# Patient Record
Sex: Female | Born: 1960 | Race: White | Hispanic: No | Marital: Married | Smoking: Never smoker
Health system: Southern US, Community
[De-identification: ages and names within clinical notes are randomized; demographics above are authoritative.]

## PROBLEM LIST (undated history)

## (undated) HISTORY — PX: LAPAROSCOPIC GASTRIC BAND REMOVAL WITH LAPAROSCOPIC GASTRIC SLEEVE RESECTION: SHX6498

---

## 2016-06-29 ENCOUNTER — Emergency Department (HOSPITAL_COMMUNITY): Payer: BLUE CROSS/BLUE SHIELD

## 2016-06-29 ENCOUNTER — Emergency Department (HOSPITAL_COMMUNITY)
Admission: EM | Admit: 2016-06-29 | Discharge: 2016-06-29 | Disposition: A | Discharge status: Home / Self Care | Payer: BLUE CROSS/BLUE SHIELD | Attending: Emergency Medicine | Admitting: Emergency Medicine

## 2016-06-29 ENCOUNTER — Encounter (HOSPITAL_COMMUNITY): Payer: Self-pay | Admitting: *Deleted

## 2016-06-29 DIAGNOSIS — R079 Chest pain, unspecified: Secondary | ICD-10-CM | POA: Diagnosis present

## 2016-06-29 DIAGNOSIS — R0789 Other chest pain: Secondary | ICD-10-CM | POA: Insufficient documentation

## 2016-06-29 DIAGNOSIS — R0602 Shortness of breath: Secondary | ICD-10-CM | POA: Insufficient documentation

## 2016-06-29 DIAGNOSIS — R110 Nausea: Secondary | ICD-10-CM | POA: Insufficient documentation

## 2016-06-29 LAB — CBC
HCT: 40.6 % (ref 36.0–46.0)
Hemoglobin: 13.2 g/dL (ref 12.0–15.0)
MCH: 28.9 pg (ref 26.0–34.0)
MCHC: 32.5 g/dL (ref 30.0–36.0)
MCV: 89 fL (ref 78.0–100.0)
PLATELETS: 170 10*3/uL (ref 150–400)
RBC: 4.56 MIL/uL (ref 3.87–5.11)
RDW: 13.1 % (ref 11.5–15.5)
WBC: 6.5 10*3/uL (ref 4.0–10.5)

## 2016-06-29 LAB — BASIC METABOLIC PANEL
Anion gap: 9 (ref 5–15)
BUN: 10 mg/dL (ref 6–20)
CHLORIDE: 105 mmol/L (ref 101–111)
CO2: 25 mmol/L (ref 22–32)
CREATININE: 0.77 mg/dL (ref 0.44–1.00)
Calcium: 9.4 mg/dL (ref 8.9–10.3)
GFR calc non Af Amer: >60 mL/min (ref >60)
GLUCOSE: 91 mg/dL (ref 65–99)
Potassium: 3.5 mmol/L (ref 3.5–5.1)
Sodium: 139 mmol/L (ref 135–145)

## 2016-06-29 LAB — I-STAT TROPONIN, ED
TROPONIN I, POC: 0 ng/mL (ref 0.00–0.08)
Troponin i, poc: 0 ng/mL (ref 0.00–0.08)

## 2016-06-29 LAB — D-DIMER, QUANTITATIVE: D-Dimer, Quant: 0.33 ug/mL-FEU (ref 0.00–0.50)

## 2016-06-29 MED ORDER — SODIUM CHLORIDE 0.9 % IV BOLUS (SEPSIS)
500.0000 mL | Freq: Once | INTRAVENOUS | Status: AC
Start: 2016-06-29 — End: 2016-06-29
  Administered 2016-06-29: 500 mL via INTRAVENOUS

## 2016-06-29 NOTE — Discharge Instructions (Signed)
Continue taking your home medications as prescribed. I recommend following up with your primary care provider within the next week for follow-up. Return to the emergency department if symptoms worsen or new onset of fever, dizziness, syncope, chest pain, difficulty breathing, abdominal pain, vomiting, numbness, weakness.

## 2016-06-29 NOTE — ED Notes (Signed)
Returned from xray

## 2016-06-29 NOTE — ED Triage Notes (Addendum)
To ED for sudden chest pressure while sitting down to eat breakfast. Nausea, no vomiting. Tearful. Pain 4/10 but 8/10 at worst. Pt flew here on Thursday from [place].

## 2016-06-29 NOTE — ED Notes (Signed)
Patient transported to CT 

## 2016-06-29 NOTE — ED Provider Notes (Signed)
[facility]-EMERGENCY DEPT Provider Note   CSN: [id number] Arrival date & time: 06/29/16  1036     History   Chief Complaint Chief Complaint  Patient presents with  . Chest Pain    HPI Angela Wilkins is a 56 y.o. female.  HPI   Patient is a 56 year old female with no pertinent past medical history presents the ED with complaint of chest pain, onset 10 AM. Patient reports when she was sitting up to order her meal at [facility] this morning she began having pressure and tightness to the right side of her chest. Endorses associated shortness of breath, lightheadedness and nausea that started with onset of chest pain. She notes those symptoms have since resolved since arrival to the ED and states her chest pain has also mildly improved but has not completely resolved. Denies taking any medications prior to arrival. Denies history of similar episodes of chest pain in the past. Denies fever, chills, headache, visual changes, cough, wheezing, palpitations, abdominal pain, vomiting, numbness, tingling, weakness, syncope. Patient denies personal or family history of cardiac disease. Denies smoking. Denies any recent surgeries or hospitalizations, history of cancer, use of exogenous hormones, or leg swelling. However patient notes they recently flew in from [place] 2 days ago and states they are in town visiting.  History reviewed. No pertinent past medical history.  There are no active problems to display for this patient.   Past Surgical History:  Procedure Laterality Date  . CESAREAN SECTION    . LAPAROSCOPIC GASTRIC BAND REMOVAL WITH LAPAROSCOPIC GASTRIC SLEEVE RESECTION      OB History    No data available       Home Medications    Prior to Admission medications   Medication Sig Start Date End Date Taking? Authorizing Provider  Naltrexone-Bupropion HCl ER (CONTRAVE) 8-90 MG TB12 Take 1 tablet by mouth 2 (two) times daily.   Yes [provider]    Family History No family  history on file.  Social History Social History  Substance Use Topics  . Smoking status: Never Smoker  . Smokeless tobacco: Never Used  . Alcohol use Yes     Comment: social      Allergies   Patient has no known allergies.   Review of Systems Review of Systems  Respiratory: Positive for shortness of breath.   Cardiovascular: Positive for chest pain.  Gastrointestinal: Positive for nausea.  Neurological: Positive for light-headedness.  All other systems reviewed and are negative.    Physical Exam Updated Vital Signs BP (!) 151/74   Pulse 82   Temp 98.2 F (36.8 C) (Oral)   Resp 14   SpO2 99%   Physical Exam  Constitutional: She is oriented to person, place, and time. She appears well-developed and well-nourished. No distress.  HENT:  Head: Normocephalic and atraumatic.  Mouth/Throat: Oropharynx is clear and moist. No oropharyngeal exudate.  Eyes: Conjunctivae and EOM are normal. Right eye exhibits no discharge. Left eye exhibits no discharge. No scleral icterus.  Neck: Normal range of motion. Neck supple.  Cardiovascular: Normal rate, regular rhythm, normal heart sounds and intact distal pulses.   Pulmonary/Chest: Effort normal and breath sounds normal. No respiratory distress. She has no wheezes. She has no rales. She exhibits no tenderness.  Abdominal: Soft. She exhibits no distension and no mass. There is no tenderness. There is no rebound and no guarding.  Musculoskeletal: Normal range of motion. She exhibits no edema or tenderness.  Neurological: She is alert and oriented to person,  place, and time.  Skin: Skin is warm and dry. She is not diaphoretic.  Nursing note and vitals reviewed.    ED Treatments / Results  Labs (all labs ordered are listed, but only abnormal results are displayed) Labs Reviewed  BASIC METABOLIC PANEL  CBC  D-DIMER, QUANTITATIVE (NOT AT [facility])  I-STAT TROPOININ, ED  I-STAT TROPOININ, ED    EKG  EKG  Interpretation  Date/Time:  Saturday June 29 2016 10:39:06 EDT Ventricular Rate:  85 PR Interval:  126 QRS Duration: 92 QT Interval:  386 QTC Calculation: 459 R Axis:   82 Text Interpretation:  Normal sinus rhythm Normal ECG No old tracing to compare Confirmed by Mancel Bale [phone]) on 06/29/2016 12:11:58 PM       Radiology Dg Chest 2 View  Result Date: 06/29/2016 CLINICAL DATA:  Right-sided chest pain.  Shortness of breath. EXAM: CHEST  2 VIEW COMPARISON:  None. FINDINGS: Suspected 8 mm nodule in the left base. No other nodules or masses. No pneumothorax. The heart, hila, mediastinum, lungs, and pleura are otherwise unremarkable. IMPRESSION: Suspected 8 mm nodule in the left base. Recommend comparison to outside films if available. If none are available, recommend CT imaging. Electronically Signed   By: Gerome Sam III M.D   On: 06/29/2016 12:46   Ct Chest Wo Contrast  Result Date: 06/29/2016 CLINICAL DATA:  Pt had chest pressure while sitting down this morning. Nausea, no vomiting. Current chest radiograph shows a possible 8 mm left lower lung zone nodule. EXAM: CT CHEST WITHOUT CONTRAST TECHNIQUE: Multidetector CT imaging of the chest was performed following the standard protocol without IV contrast. COMPARISON:  Current chest radiograph FINDINGS: Cardiovascular: Heart is normal size and configuration. No coronary artery calcifications. Great vessels normal in caliber. No significant aortic atherosclerosis. Mediastinum/Nodes: No enlarged mediastinal or axillary lymph nodes. Thyroid gland, trachea, and esophagus demonstrate no significant findings. Lungs/Pleura: Lungs are clear. Specifically, there is no lung nodule. No pleural effusion or pneumothorax. Upper Abdomen: There changes from prior gastric surgery. No acute findings. Musculoskeletal: There is a focus of sclerosis in the anterior left sixth rib consistent with a bone island. This accounts for the nodule noted on the current  chest radiograph. There is no fracture or acute finding. No osteolytic lesions. IMPRESSION: 1. No lung mass or nodule. The small nodule noted on the current chest radiograph is due to a bone island in the anterior left sixth rib. 2. No acute findings. Clear lungs. No coronary artery calcifications. Electronically Signed   By: Amie Portland M.D.   On: 06/29/2016 14:57    Procedures Procedures (including critical care time)  Medications Ordered in ED Medications  sodium chloride 0.9 % bolus 500 mL (500 mLs Intravenous New Bag/Given 06/29/16 1526)     Initial Impression / Assessment and Plan / ED Course  I have reviewed the triage vital signs and the nursing notes.  Pertinent labs & imaging results that were available during my care of the patient were reviewed by me and considered in my medical decision making (see chart for details).    Patient is a 56 year old female with no pertinent past medical history presents the ED with episode of chest pain with associated shortness of breath, lightheadedness and nausea that occurred earlier today while she was sitting up to order breakfast. She reports her other symptoms have resolved and her chest tightness/pressure has slightly improved since arrival to the ED. Denies personal or family history of cardiac disease. VSS. Exam unremarkable. EKG showed  sinus rhythm with no acute ischemic changes. Troponin negative. D-dimer negative. Labs unremarkable. Chest x-ray showed 8 mm nodule in the left lung base, recommend CT imaging for further evaluation. Discussed incidental findings with patient. Patient is requesting to have CT chest performed while in the ED today as she states she is from out of town and is concerned about not being able to have the imaging performed. Delta trop 0.00 (notified via phone by myself from ED mini-lab). HEART score 2. I have a low suspicion for ACS, PE, dissection, or other acute cardiac event at this time. CT chest showed no lung  mass or nodule, small nodule reported on CXR is due to a bone island in anterior left 6th rib. Discussed results with pt. Plan to d/c pt home with PCP follow next week when she returns home. Discussed return precautions.   Final Clinical Impressions(s) / ED Diagnoses   Final diagnoses:  Atypical chest pain    New Prescriptions New Prescriptions   No medications on file     Barrett Henleadeau, Sekou Zuckerman Elizabeth, Cordelia Mossbarger-C 06/29/16 1541    Mancel BaleWentz, Elliott, MD 07/07/16 [phone]

## 2016-06-29 NOTE — ED Notes (Signed)
Patient transported to X-ray 

## 2018-08-09 IMAGING — CT CT CHEST W/O CM
2 of 3 series · 15 of 36 positions shown, 18 images · non-contrast
Comparison: Current chest radiograph

CLINICAL DATA: Pt had chest pressure while sitting down this
morning. Nausea, no vomiting. Current chest radiograph shows a
possible 8 mm left lower lung zone nodule.

EXAM:
CT CHEST WITHOUT CONTRAST
TECHNIQUE: Multidetector CT imaging of the chest was performed following the
standard protocol without IV contrast.

[Series 3: chest w/o 2mm st · axial · non-contrast · 0.88mm/px · z∈[+1148,+1444]mm · 12 of 174 slices shown, 15 images]
[im 13/174  mediastinal]
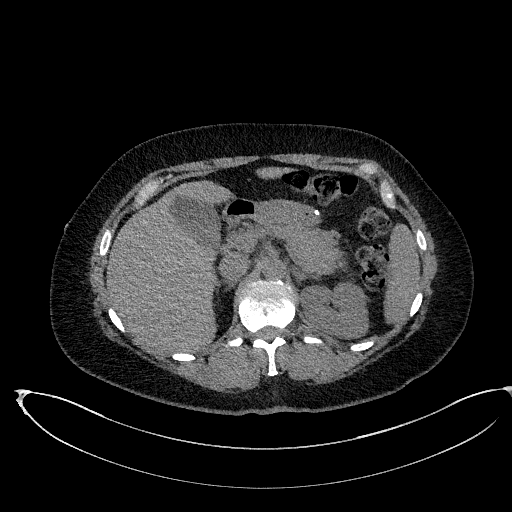
[im 13/174  lung]
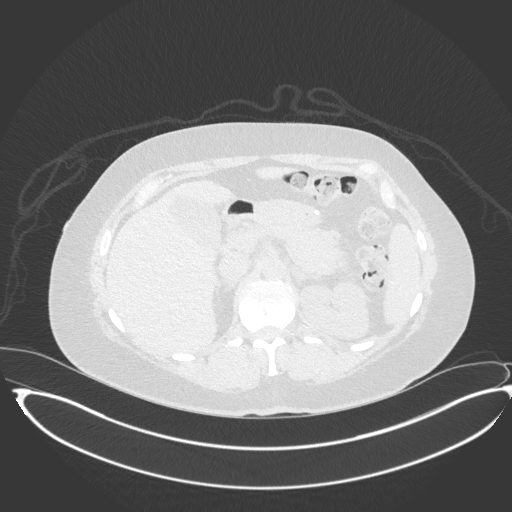
[im 26/174  lung]
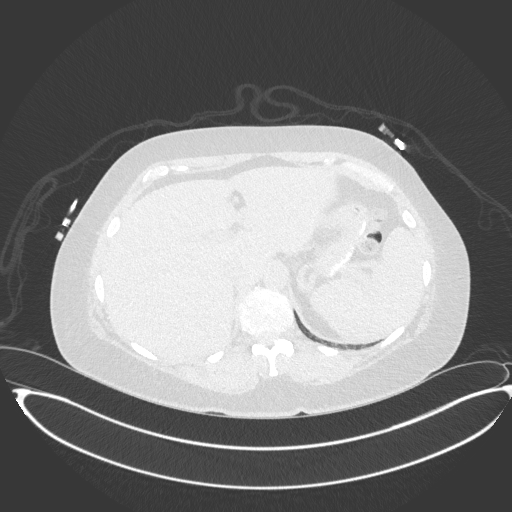
[im 39/174  lung]
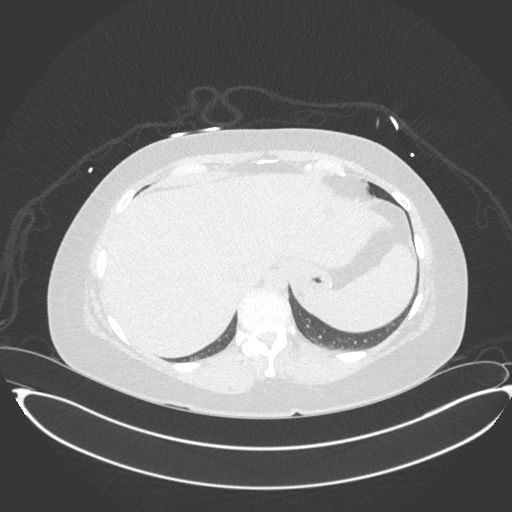
[im 52/174  lung]
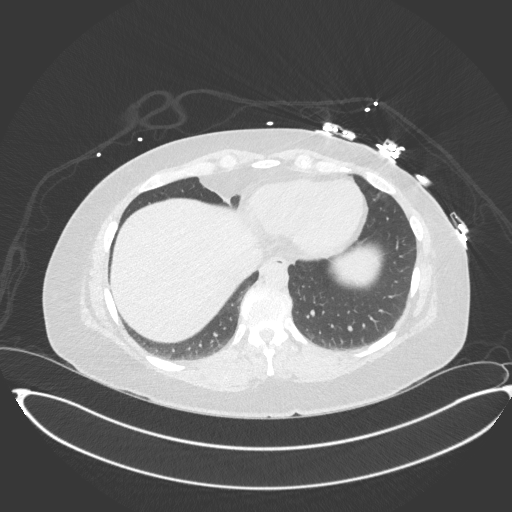
[im 65/174  mediastinal]
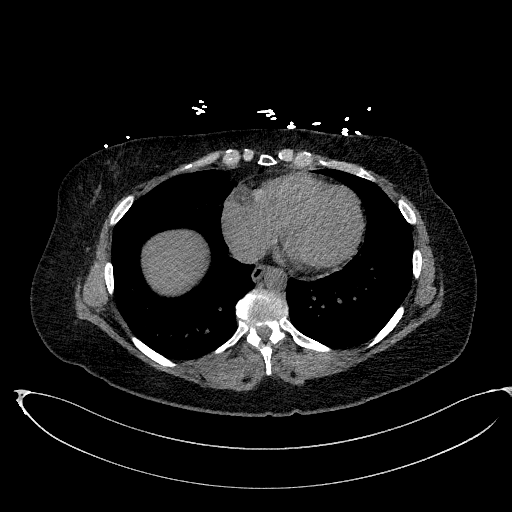
[im 65/174  lung]
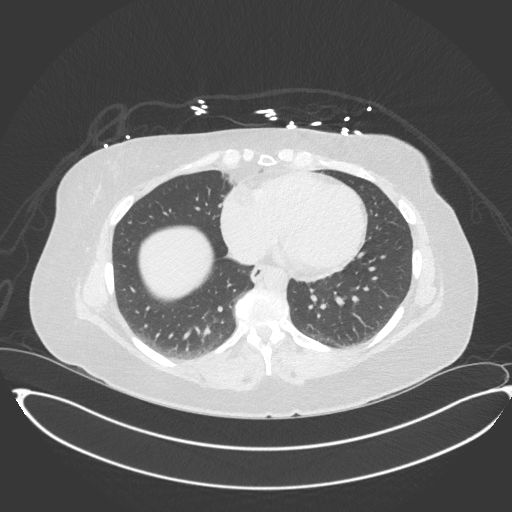
[im 77/174  lung]
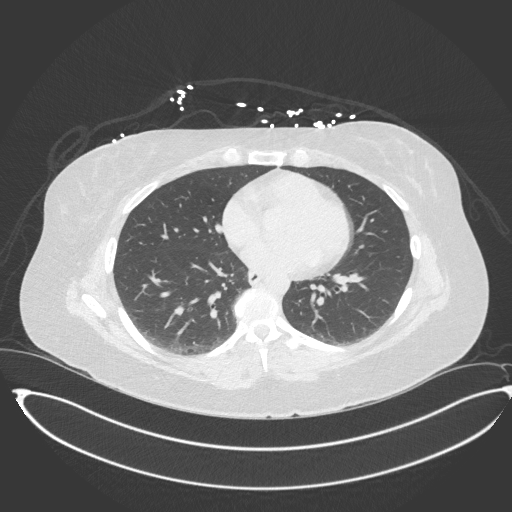
[im 97/174  lung]
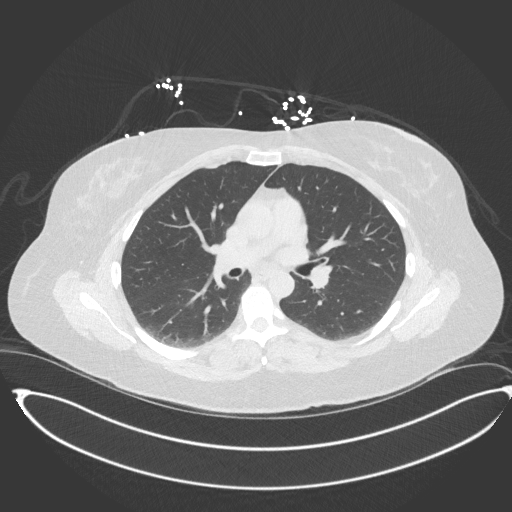
[im 109/174  lung]
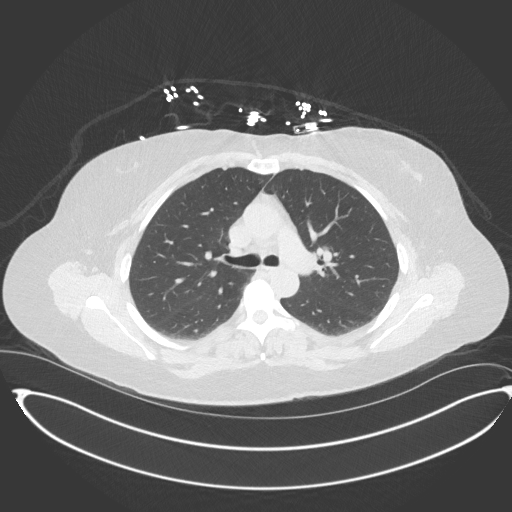
[im 122/174  mediastinal]
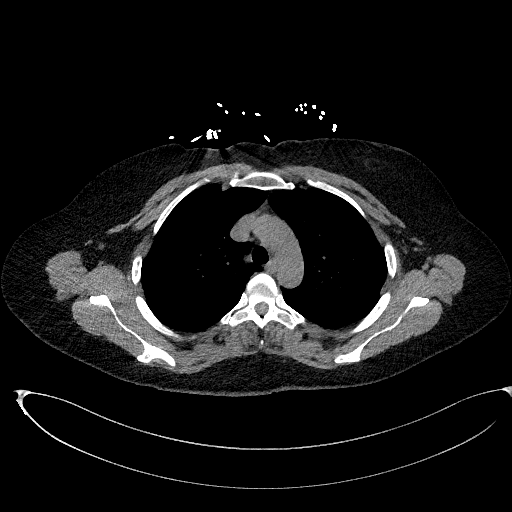
[im 122/174  lung]
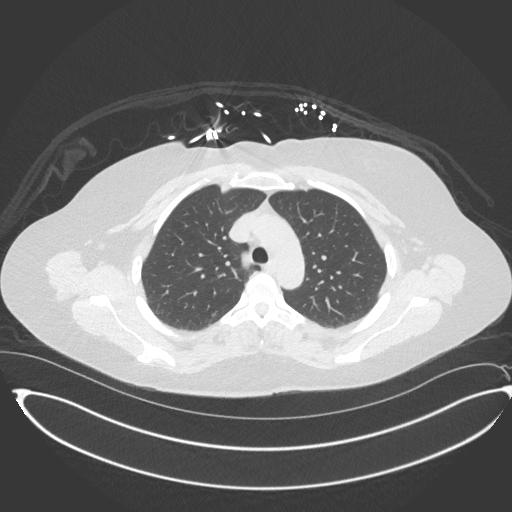
[im 135/174  lung]
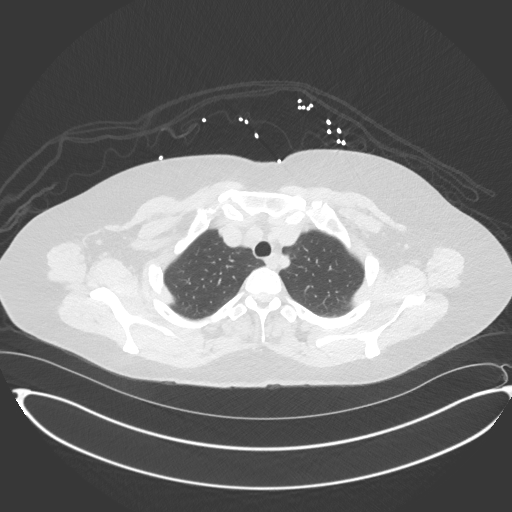
[im 148/174  lung]
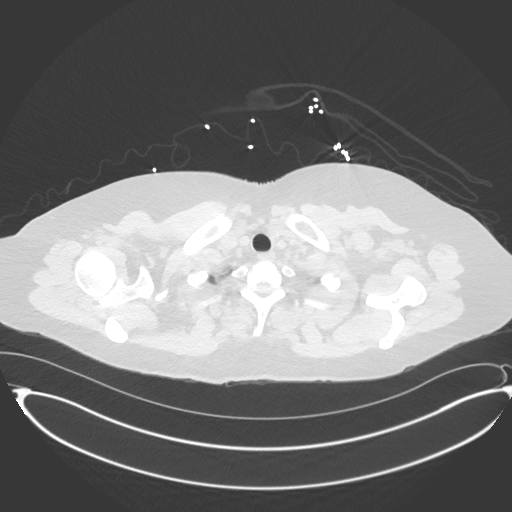
[im 161/174  lung]
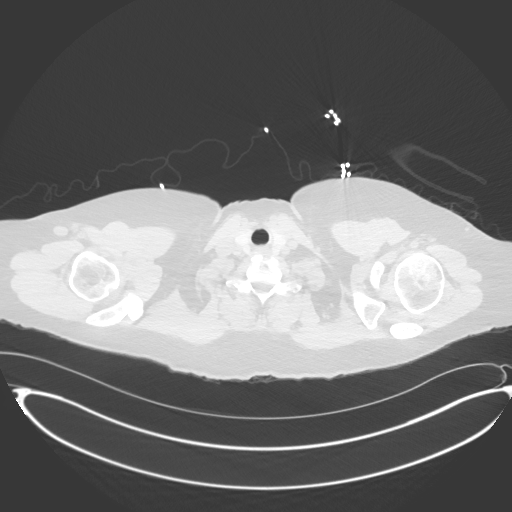

[Series 5: chest w/o 3mm st cor · coronal · non-contrast · 0.62mm/px · 3 of 100 slices shown]
[im 20/100  lung]
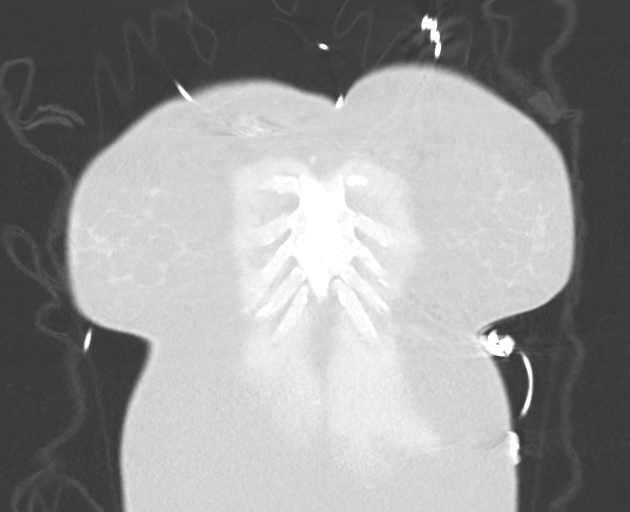
[im 40/100  lung]
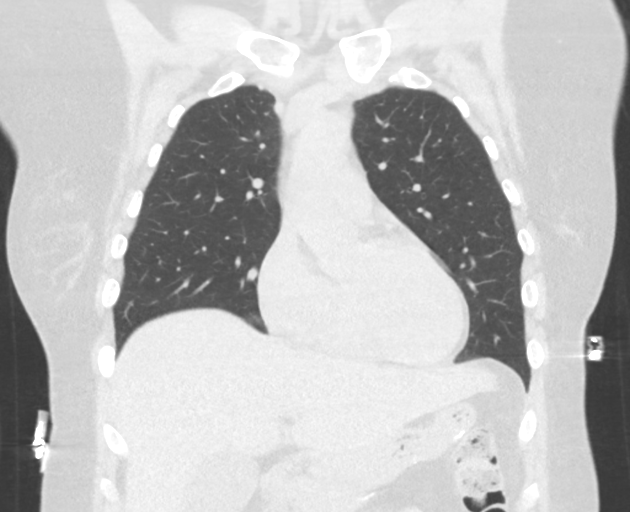
[im 60/100  lung]
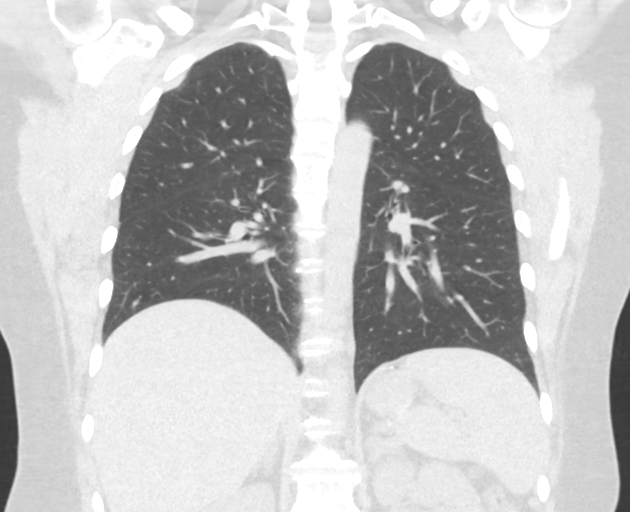

[15 of 36 positions shown; findings below may reference images not displayed]

FINDINGS: Cardiovascular: Heart is normal size and configuration. No coronary
artery calcifications. Great vessels normal in caliber. No
significant aortic atherosclerosis.

Mediastinum/Nodes: No enlarged mediastinal or axillary lymph nodes.
Thyroid gland, trachea, and esophagus demonstrate no significant
findings.

Lungs/Pleura: Lungs are clear. Specifically, there is no lung
nodule. No pleural effusion or pneumothorax.

Upper Abdomen: There changes from prior gastric surgery. No acute
findings.

Musculoskeletal: There is a focus of sclerosis in the anterior left
sixth rib consistent with a bone island. This accounts for the
nodule noted on the current chest radiograph. There is no fracture
or acute finding. No osteolytic lesions.
IMPRESSION: 1. No lung mass or nodule. The small nodule noted on the current
chest radiograph is due to a bone island in the anterior left sixth
rib.
2. No acute findings. Clear lungs. No coronary artery
calcifications.
